# Patient Record
Sex: Male | Born: 2000 | Race: White | Hispanic: No | Marital: Single | Smoking: Never smoker
Health system: Southern US, Community
[De-identification: ages and names within clinical notes are randomized; demographics above are authoritative.]

## PROBLEM LIST (undated history)

## (undated) DIAGNOSIS — K0889 Other specified disorders of teeth and supporting structures: Secondary | ICD-10-CM

## (undated) DIAGNOSIS — J45909 Unspecified asthma, uncomplicated: Secondary | ICD-10-CM

## (undated) DIAGNOSIS — K219 Gastro-esophageal reflux disease without esophagitis: Secondary | ICD-10-CM

## (undated) DIAGNOSIS — S67190A Crushing injury of right index finger, initial encounter: Secondary | ICD-10-CM

## (undated) HISTORY — PX: ADENOIDECTOMY: SUR15

## (undated) HISTORY — PX: TYMPANOSTOMY TUBE PLACEMENT: SHX32

## (undated) HISTORY — PX: INGUINAL HERNIA REPAIR: SUR1180

---
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
## 2021-01-12 ENCOUNTER — Emergency Department (HOSPITAL_BASED_OUTPATIENT_CLINIC_OR_DEPARTMENT_OTHER)
Admission: EM | Admit: 2021-01-12 | Discharge: 2021-01-12 | Disposition: A | Discharge status: Home / Self Care | Payer: BC Managed Care – PPO | Attending: Emergency Medicine | Admitting: Emergency Medicine

## 2021-01-12 ENCOUNTER — Encounter (HOSPITAL_BASED_OUTPATIENT_CLINIC_OR_DEPARTMENT_OTHER): Payer: Self-pay | Admitting: Emergency Medicine

## 2021-01-12 ENCOUNTER — Other Ambulatory Visit: Payer: Self-pay

## 2021-01-12 ENCOUNTER — Emergency Department (HOSPITAL_BASED_OUTPATIENT_CLINIC_OR_DEPARTMENT_OTHER): Payer: BC Managed Care – PPO

## 2021-01-12 DIAGNOSIS — N201 Calculus of ureter: Secondary | ICD-10-CM | POA: Insufficient documentation

## 2021-01-12 DIAGNOSIS — J45909 Unspecified asthma, uncomplicated: Secondary | ICD-10-CM | POA: Diagnosis not present

## 2021-01-12 DIAGNOSIS — R109 Unspecified abdominal pain: Secondary | ICD-10-CM | POA: Diagnosis present

## 2021-01-12 LAB — URINALYSIS, ROUTINE W REFLEX MICROSCOPIC
Bilirubin Urine: NEGATIVE
Glucose, UA: NEGATIVE mg/dL
Ketones, ur: 15 mg/dL — AB
Leukocytes,Ua: NEGATIVE
Nitrite: NEGATIVE
Protein, ur: NEGATIVE mg/dL
Specific Gravity, Urine: >1.03 — ABNORMAL HIGH (ref 1.005–1.030)
pH: 6 (ref 5.0–8.0)

## 2021-01-12 LAB — URINALYSIS, MICROSCOPIC (REFLEX)

## 2021-01-12 MED ORDER — FENTANYL CITRATE (PF) 100 MCG/2ML IJ SOLN
50.0000 ug | Freq: Once | INTRAMUSCULAR | Status: DC
  Filled 2021-01-12: qty 2

## 2021-01-12 MED ORDER — HYDROMORPHONE HCL 2 MG PO TABS: 2.0000 mg | ORAL_TABLET | ORAL | 0 refills | Status: AC | PRN

## 2021-01-12 MED ORDER — SODIUM CHLORIDE 0.9 % IV SOLN: Freq: Once | INTRAVENOUS | Status: AC

## 2021-01-12 MED ORDER — ONDANSETRON HCL 4 MG/2ML IJ SOLN
4.0000 mg | Freq: Once | INTRAMUSCULAR | Status: AC
  Administered 2021-01-12: 03:00:00 4 mg via INTRAVENOUS
  Filled 2021-01-12: qty 2

## 2021-01-12 MED ORDER — NAPROXEN 375 MG PO TABS: ORAL_TABLET | ORAL | 0 refills | Status: AC

## 2021-01-12 MED ORDER — METOCLOPRAMIDE HCL 5 MG/ML IJ SOLN
10.0000 mg | Freq: Once | INTRAMUSCULAR | Status: AC
  Administered 2021-01-12: 10 mg via INTRAVENOUS
  Filled 2021-01-12: qty 2

## 2021-01-12 MED ORDER — FENTANYL CITRATE (PF) 100 MCG/2ML IJ SOLN
50.0000 ug | INTRAMUSCULAR | Status: AC | PRN
  Administered 2021-01-12 (×2): 50 ug via INTRAVENOUS
  Filled 2021-01-12: qty 2

## 2021-01-12 MED ORDER — KETOROLAC TROMETHAMINE 15 MG/ML IJ SOLN
15.0000 mg | Freq: Once | INTRAMUSCULAR | Status: AC
  Administered 2021-01-12: 15 mg via INTRAVENOUS
  Filled 2021-01-12: qty 1

## 2021-01-12 MED ORDER — METOCLOPRAMIDE HCL 10 MG PO TABS: 10.0000 mg | ORAL_TABLET | Freq: Four times a day (QID) | ORAL | 0 refills | Status: AC | PRN

## 2021-01-12 NOTE — ED Provider Notes (Signed)
[facility] Provider Note: Alec Dell, MD, FACEP  CSN: [id number] MRN: [id number] ARRIVAL: 01/12/21 at 0243 ROOM: [facility]   CHIEF COMPLAINT  Flank Pain   HISTORY OF PRESENT ILLNESS  01/12/21 3:11 AM Alec Pineda is a 20 y.o. male with right flank pain radiating to his right groin that woke him up an hour or 2 ago.  He rates the pain as a 10 out of 10 and describes it as sharp and stabbing.  It is worse with movement.  He has had associated nausea and vomiting with this.  He has not noticed any hematuria, dysuria or difficulty urinating.   Past Medical History:  Diagnosis Date   Acid reflux    controlled with diet   Asthma    prn inhaler   Crushing injury of right index finger 06/09/2013   with fracture of finger   Tooth loose 06/13/2013    Past Surgical History:  Procedure Laterality Date   ADENOIDECTOMY     INGUINAL HERNIA REPAIR  age 71 weeks   OPEN REDUCTION INTERNAL FIXATION (ORIF) FINGER WITH RADIAL BONE GRAFT Right 06/14/2013   Procedure: OPEN REDUCTION INTERNAL FIXATION (ORIF) OF RIGHT INDEX DISTAL PHALANX;  Surgeon: Wyn Forster., MD;  Location: [facility];  Service: Orthopedics;  Laterality: Right;   TYMPANOSTOMY TUBE PLACEMENT Bilateral    x 3    Family History  Problem Relation Age of Onset   Asthma Mother        as a child   Anesthesia problems Mother        post-op N/V   Asthma Sister    Asthma Maternal Grandmother    Anesthesia problems Maternal Grandmother        post-op N/V   Hypertension Maternal Grandfather     Social History   Tobacco Use   Smoking status: Never   Smokeless tobacco: Never  Substance Use Topics   Alcohol use: No   Drug use: No    Prior to Admission medications   Medication Sig Start Date End Date Taking? Authorizing Provider  HYDROmorphone (DILAUDID) 2 MG tablet Take 1 tablet (2 mg total) by mouth every 4 (four) hours as needed for severe pain. 01/12/21  Yes Averi Cacioppo, MD   metoCLOPramide (REGLAN) 10 MG tablet Take 1 tablet (10 mg total) by mouth every 6 (six) hours as needed for nausea or vomiting. 01/12/21  Yes Taylorann Tkach, MD  naproxen (NAPROSYN) 375 MG tablet Take 1 tablet twice daily until stone passes. 01/12/21  Yes Dior Stepter, MD    Allergies Patient has no known allergies.   REVIEW OF SYSTEMS  Negative except as noted here or in the History of Present Illness.   PHYSICAL EXAMINATION  Initial Vital Signs Blood pressure (!) 146/95, pulse 64, temperature 97.6 F (36.4 C), temperature source Oral, resp. rate 18, height 5\' 9"  (1.753 m), weight 60.3 kg, SpO2 100 %.  Examination General: Well-developed, well-nourished male in no acute distress; appearance consistent with age of record HENT: normocephalic; atraumatic Eyes: pupils equal, round and reactive to light; extraocular muscles intact Neck: supple Heart: regular rate and rhythm Lungs: clear to auscultation bilaterally Abdomen: soft; nondistended; right lower quadrant tenderness; bowel sounds present GU: Right CVA tenderness; no scrotal tenderness Extremities: No deformity; full range of motion; pulses normal Neurologic: Awake, alert and oriented; motor function intact in all extremities and symmetric; no facial droop Skin: Warm and dry Psychiatric: Normal mood and affect   RESULTS  Summary  of this visit's results, reviewed and interpreted by myself:   EKG Interpretation  Date/Time:    Ventricular Rate:    PR Interval:    QRS Duration:   QT Interval:    QTC Calculation:   R Axis:     Text Interpretation:          Laboratory Studies: Results for orders placed or performed during the hospital encounter of 01/12/21 (from the past 24 hour(s))  Urinalysis, Routine w reflex microscopic     Status: Abnormal   Collection Time: 01/12/21  3:19 AM  Result Value Ref Range   Color, Urine YELLOW YELLOW   APPearance CLEAR CLEAR   Specific Gravity, Urine >1.030 (H) 1.005 - 1.030   pH  6.0 5.0 - 8.0   Glucose, UA NEGATIVE NEGATIVE mg/dL   Hgb urine dipstick SMALL (A) NEGATIVE   Bilirubin Urine NEGATIVE NEGATIVE   Ketones, ur 15 (A) NEGATIVE mg/dL   Protein, ur NEGATIVE NEGATIVE mg/dL   Nitrite NEGATIVE NEGATIVE   Leukocytes,Ua NEGATIVE NEGATIVE  Urinalysis, Microscopic (reflex)     Status: Abnormal   Collection Time: 01/12/21  3:19 AM  Result Value Ref Range   RBC / HPF 6-10 0 - 5 RBC/hpf   WBC, UA 6-10 0 - 5 WBC/hpf   Bacteria, UA FEW (A) NONE SEEN   Squamous Epithelial / LPF 21-50 0 - 5   Mucus PRESENT    Ca Oxalate Crys, UA PRESENT    Imaging Studies: CT Renal Stone Study  Result Date: 01/12/2021 CLINICAL DATA:  Right flank pain with nausea and vomiting, groin pain which woke the patient 1-2 hours ago EXAM: CT ABDOMEN AND PELVIS WITHOUT CONTRAST TECHNIQUE: Multidetector CT imaging of the abdomen and pelvis was performed following the standard protocol without IV contrast. COMPARISON:  None. FINDINGS: Lower chest: Lung bases are clear. Normal heart size. No pericardial effusion. Slight hypoattenuation of the cardiac blood pool may reflect a mild anemia. Hepatobiliary: No visible focal liver abnormality within limitations of an unenhanced exam. Normal gallbladder. No visible calcified gallstones. No biliary ductal dilatation. Pancreas: No pancreatic ductal dilatation or surrounding inflammatory changes. Spleen: Normal in size. No concerning splenic lesions. Adrenals/Urinary Tract: Normal adrenals. No concerning visible or contour deforming renal lesions. Asymmetric moderate right hydronephrosis to the level of a distal 2 mm right ureteral calculus (2/70). Additional punctate 2 mm nonobstructing calculus in the interpolar right kidney. No visible left urinary tract dilatation or urolithiasis. Urinary bladder is largely decompressed at the time of exam. Mild bladder wall thickening may be related to this underdistention. Stomach/Bowel: Distal esophagus, stomach and duodenal  sweep are unremarkable. No small bowel wall thickening or dilatation. No evidence of obstruction. A normal appendix is visualized. No colonic dilatation or wall thickening. Vascular/Lymphatic: No significant vascular findings are present. No enlarged abdominal or pelvic lymph nodes. Reproductive: The prostate and seminal vesicles are unremarkable. Other: No abdominopelvic free fluid or free gas. No bowel containing hernias. Musculoskeletal: No acute osseous abnormality or suspicious osseous lesion. IMPRESSION: Moderate right hydroureteronephrosis to the level of a 2 mm calculus in the distal right ureter just proximal to the ureterovesicular junction. Additional nonobstructing punctate calculus in the interpolar right kidney as well. Mild bladder wall thickening likely related to underdistention though should assess urinary symptoms and consider correlation with urinalysis where appropriate. Electronically Signed   By: Kreg Shropshire [name]   On: 01/12/2021 04:15    ED COURSE and MDM  Nursing notes, initial and subsequent vitals signs, including pulse  oximetry, reviewed and interpreted by myself.  Vitals:   01/12/21 0259 01/12/21 0303 01/12/21 0305  BP:  (!) 146/95 (!) 146/95  Pulse:  64 67  Resp:  18 18  Temp:  97.6 F (36.4 C)   TempSrc:  Oral   SpO2:  100% 100%  Weight: 60.3 kg    Height: 5\' 9"  (1.753 m)     Medications  fentaNYL (SUBLIMAZE) injection 50 mcg (50 mcg Intravenous Not Given 01/12/21 0322)  ondansetron (ZOFRAN) injection 4 mg (4 mg Intravenous Given 01/12/21 0314)  fentaNYL (SUBLIMAZE) injection 50 mcg (50 mcg Intravenous Given 01/12/21 0321)  0.9 %  sodium chloride infusion ( Intravenous Stopped 01/12/21 0430)  ketorolac (TORADOL) 15 MG/ML injection 15 mg (15 mg Intravenous Given 01/12/21 0405)  metoCLOPramide (REGLAN) injection 10 mg (10 mg Intravenous Given 01/12/21 0410)   4:44 AM Patient sleeping comfortably after Toradol and Reglan.  Patient had not gotten adequate relief from  initial fentanyl and Zofran.  His stone is small enough that he will likely pass it without urologic intervention.   PROCEDURES  Procedures   ED DIAGNOSES     ICD-10-CM   1. Ureterolithiasis  N20.1          Shanin Szymanowski, MD 01/12/21 [phone]

## 2021-01-12 NOTE — ED Triage Notes (Signed)
Patient presents with right flank pain and nausea and vomiting and pain in groin that woke him up 1-2 hours ago.

## 2021-01-12 NOTE — ED Notes (Signed)
Dr Read Drivers at bedside; order received to give Fentanyl IVP; order entered per Dr Read Drivers to give additional for total 

## 2022-09-23 IMAGING — CT CT RENAL STONE PROTOCOL
2 of 4 series · 15 of 46 positions shown, 17 images · non-contrast
Comparison: None.

CLINICAL DATA: Right flank pain with nausea and vomiting, groin
pain which woke the patient 1-2 hours ago

EXAM:
CT ABDOMEN AND PELVIS WITHOUT CONTRAST
TECHNIQUE: Multidetector CT imaging of the abdomen and pelvis was performed
following the standard protocol without IV contrast.

[Series 2: axial st · axial · 0.88mm/px · z∈[-426,-36]mm · 12 of 90 slices shown, 14 images]
[im 8/90  soft-tissue]
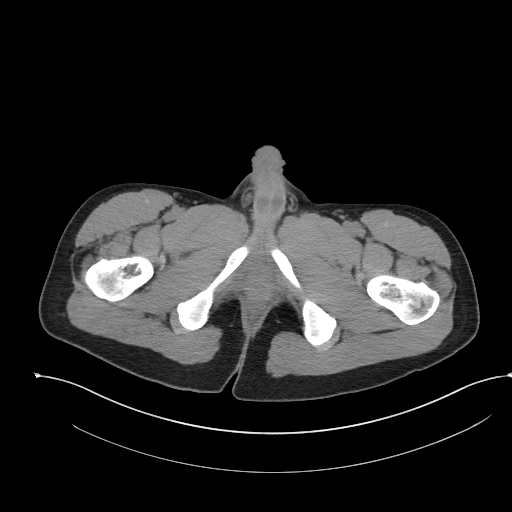
[im 8/90  bone]
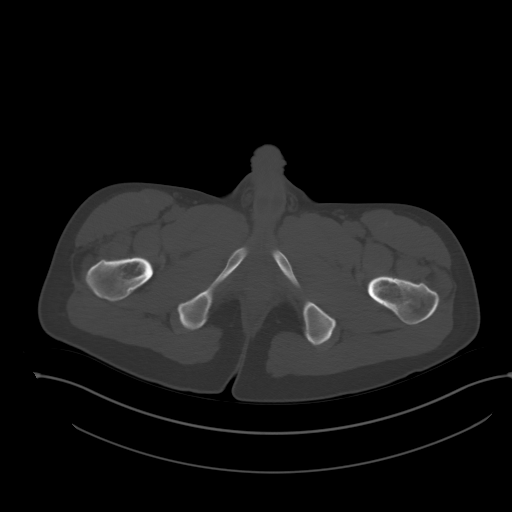
[im 15/90  soft-tissue]
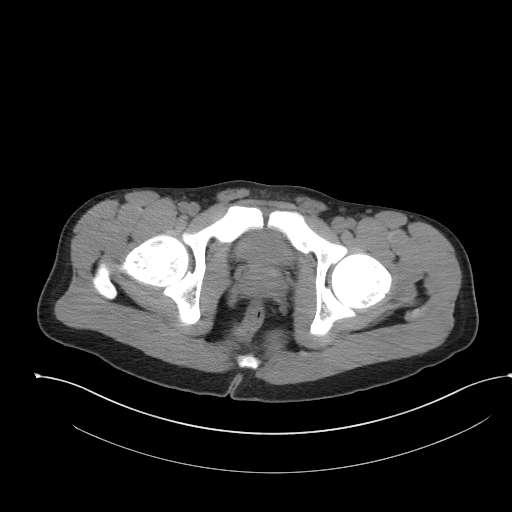
[im 22/90  soft-tissue]
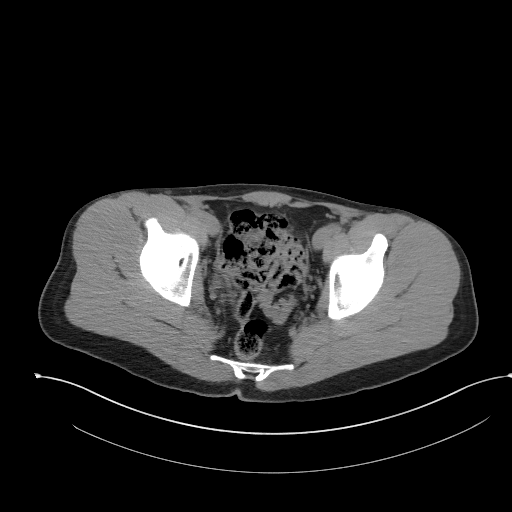
[im 29/90  soft-tissue]
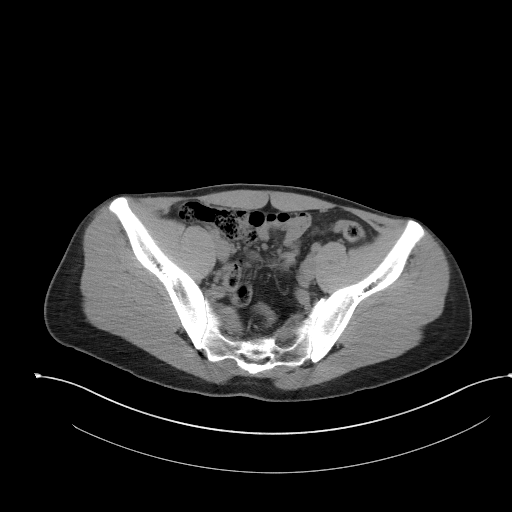
[im 36/90  soft-tissue]
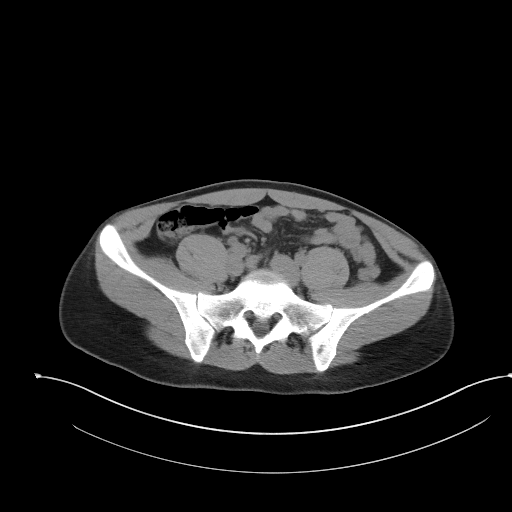
[im 43/90  soft-tissue]
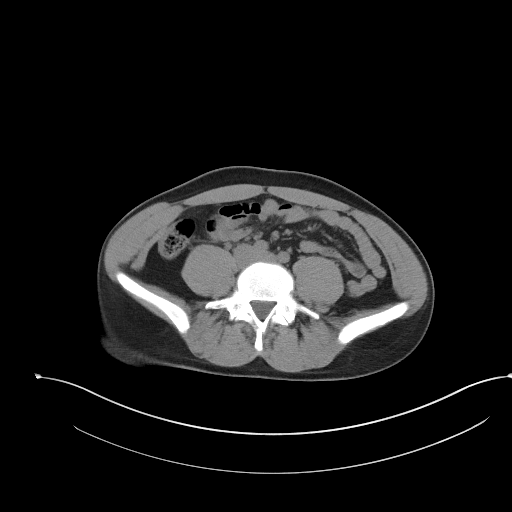
[im 50/90  soft-tissue]
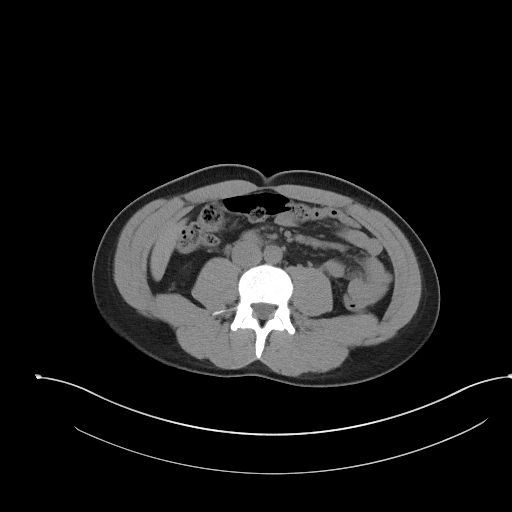
[im 57/90  soft-tissue]
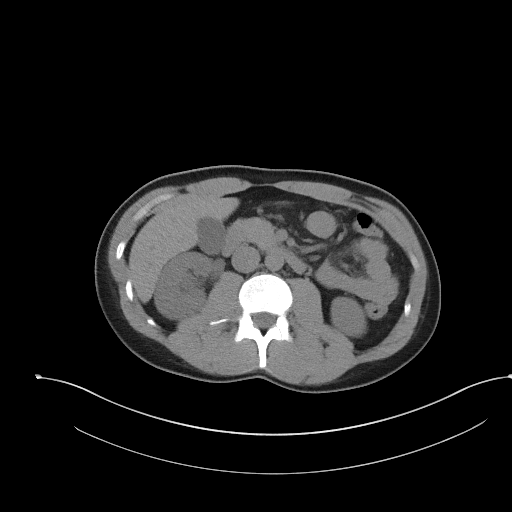
[im 65/90  soft-tissue]
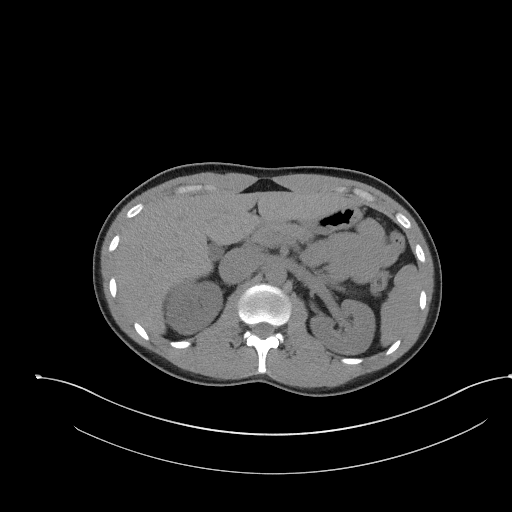
[im 65/90  bone]
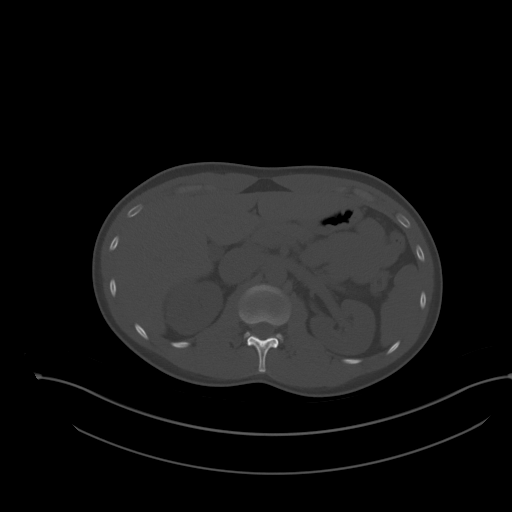
[im 72/90  soft-tissue]
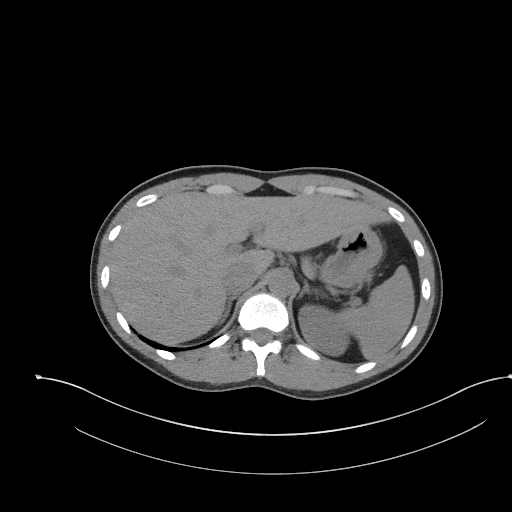
[im 79/90  soft-tissue]
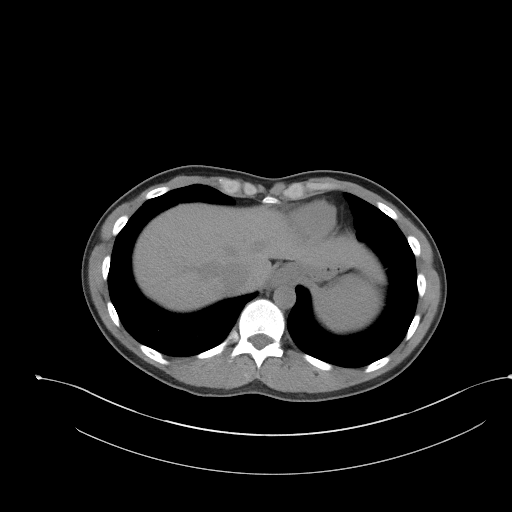
[im 86/90  soft-tissue]
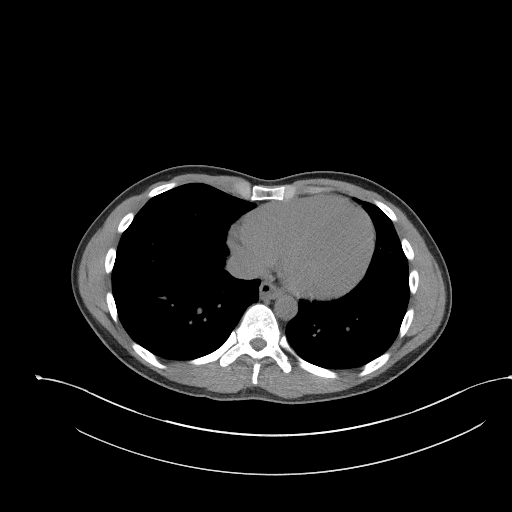

[Series 5: coronal st · coronal · 0.67mm/px · 3 of 85 slices shown]
[im 29/85  soft-tissue]
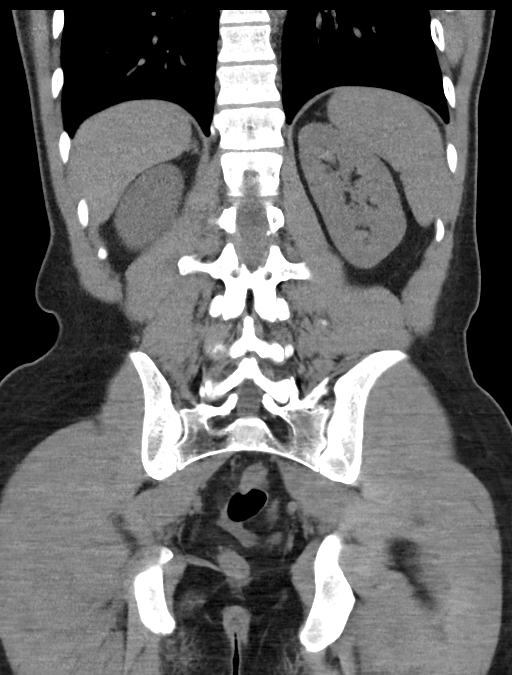
[im 38/85  soft-tissue]
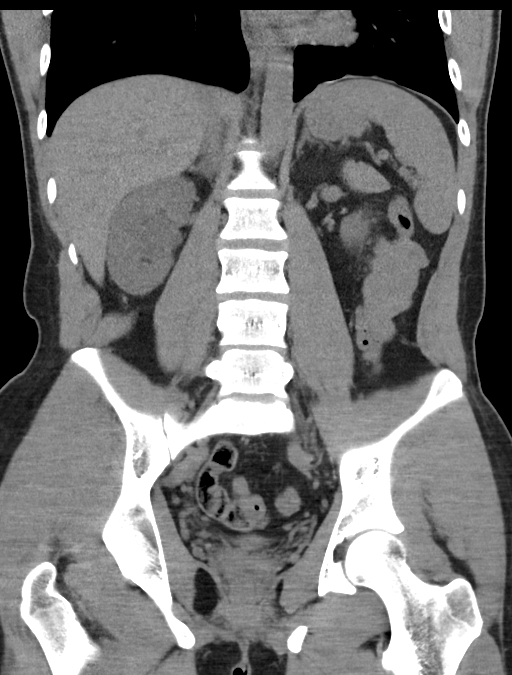
[im 47/85  soft-tissue]
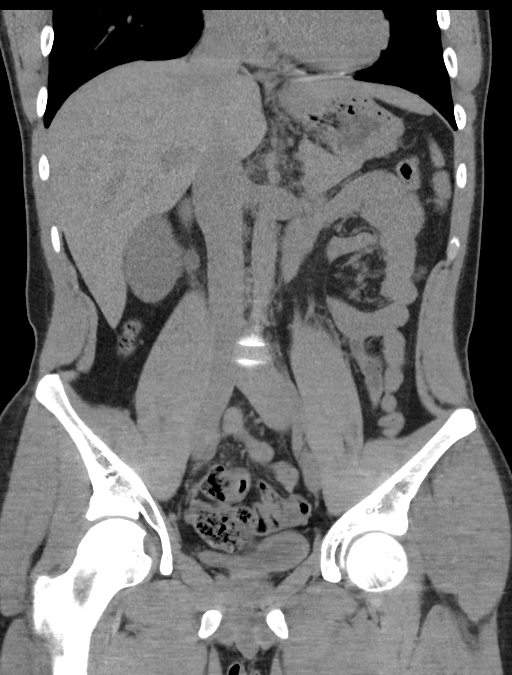

[15 of 46 positions shown; findings below may reference images not displayed]

FINDINGS: Lower chest: Lung bases are clear. Normal heart size. No pericardial
effusion. Slight hypoattenuation of the cardiac blood pool may
reflect a mild anemia.

Hepatobiliary: No visible focal liver abnormality within limitations
of an unenhanced exam. Normal gallbladder. No visible calcified
gallstones. No biliary ductal dilatation.

Pancreas: No pancreatic ductal dilatation or surrounding
inflammatory changes.

Spleen: Normal in size. No concerning splenic lesions.

Adrenals/Urinary Tract: Normal adrenals. No concerning visible or
contour deforming renal lesions. Asymmetric moderate right
hydronephrosis to the level of a distal 2 mm right ureteral calculus
(2/70). Additional punctate 2 mm nonobstructing calculus in the
interpolar right kidney. No visible left urinary tract dilatation or
urolithiasis. Urinary bladder is largely decompressed at the time of
exam. Mild bladder wall thickening may be related to this
underdistention.

Stomach/Bowel: Distal esophagus, stomach and duodenal sweep are
unremarkable. No small bowel wall thickening or dilatation. No
evidence of obstruction. A normal appendix is visualized. No colonic
dilatation or wall thickening.

Vascular/Lymphatic: No significant vascular findings are present. No
enlarged abdominal or pelvic lymph nodes.

Reproductive: The prostate and seminal vesicles are unremarkable.

Other: No abdominopelvic free fluid or free gas. No bowel containing
hernias.

Musculoskeletal: No acute osseous abnormality or suspicious osseous
lesion.
IMPRESSION: Moderate right hydroureteronephrosis to the level of a 2 mm calculus
in the distal right ureter just proximal to the ureterovesicular
junction. Additional nonobstructing punctate calculus in the
interpolar right kidney as well.

Mild bladder wall thickening likely related to underdistention
though should assess urinary symptoms and consider correlation with
urinalysis where appropriate.
